# Patient Record
Sex: Male | Born: 1990
Health system: Southern US, Community
[De-identification: ages and names within clinical notes are randomized; demographics above are authoritative.]

## PROBLEM LIST (undated history)

## (undated) DIAGNOSIS — S060X9A Concussion with loss of consciousness of unspecified duration, initial encounter: Secondary | ICD-10-CM

## (undated) DIAGNOSIS — J189 Pneumonia, unspecified organism: Secondary | ICD-10-CM

## (undated) DIAGNOSIS — S060XAA Concussion with loss of consciousness status unknown, initial encounter: Secondary | ICD-10-CM

## (undated) DIAGNOSIS — H9319 Tinnitus, unspecified ear: Secondary | ICD-10-CM

## (undated) DIAGNOSIS — F4310 Post-traumatic stress disorder, unspecified: Secondary | ICD-10-CM

## (undated) HISTORY — DX: Concussion with loss of consciousness status unknown, initial encounter: S06.0XAA

## (undated) HISTORY — PX: KNEE SURGERY: SHX244

## (undated) HISTORY — DX: Concussion with loss of consciousness of unspecified duration, initial encounter: S06.0X9A

## (undated) HISTORY — DX: Post-traumatic stress disorder, unspecified: F43.10

## (undated) HISTORY — DX: Pneumonia, unspecified organism: J18.9

## (undated) HISTORY — DX: Tinnitus, unspecified ear: H93.19

---
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
## 2009-07-15 IMAGING — CR DG CHEST 2V
2 series · 2 of 2 positions shown · non-contrast
Comparison: none

HISTORY: Pneumonia, oxygen desaturation, weakness, fever

[view not recorded (1 of 2)]
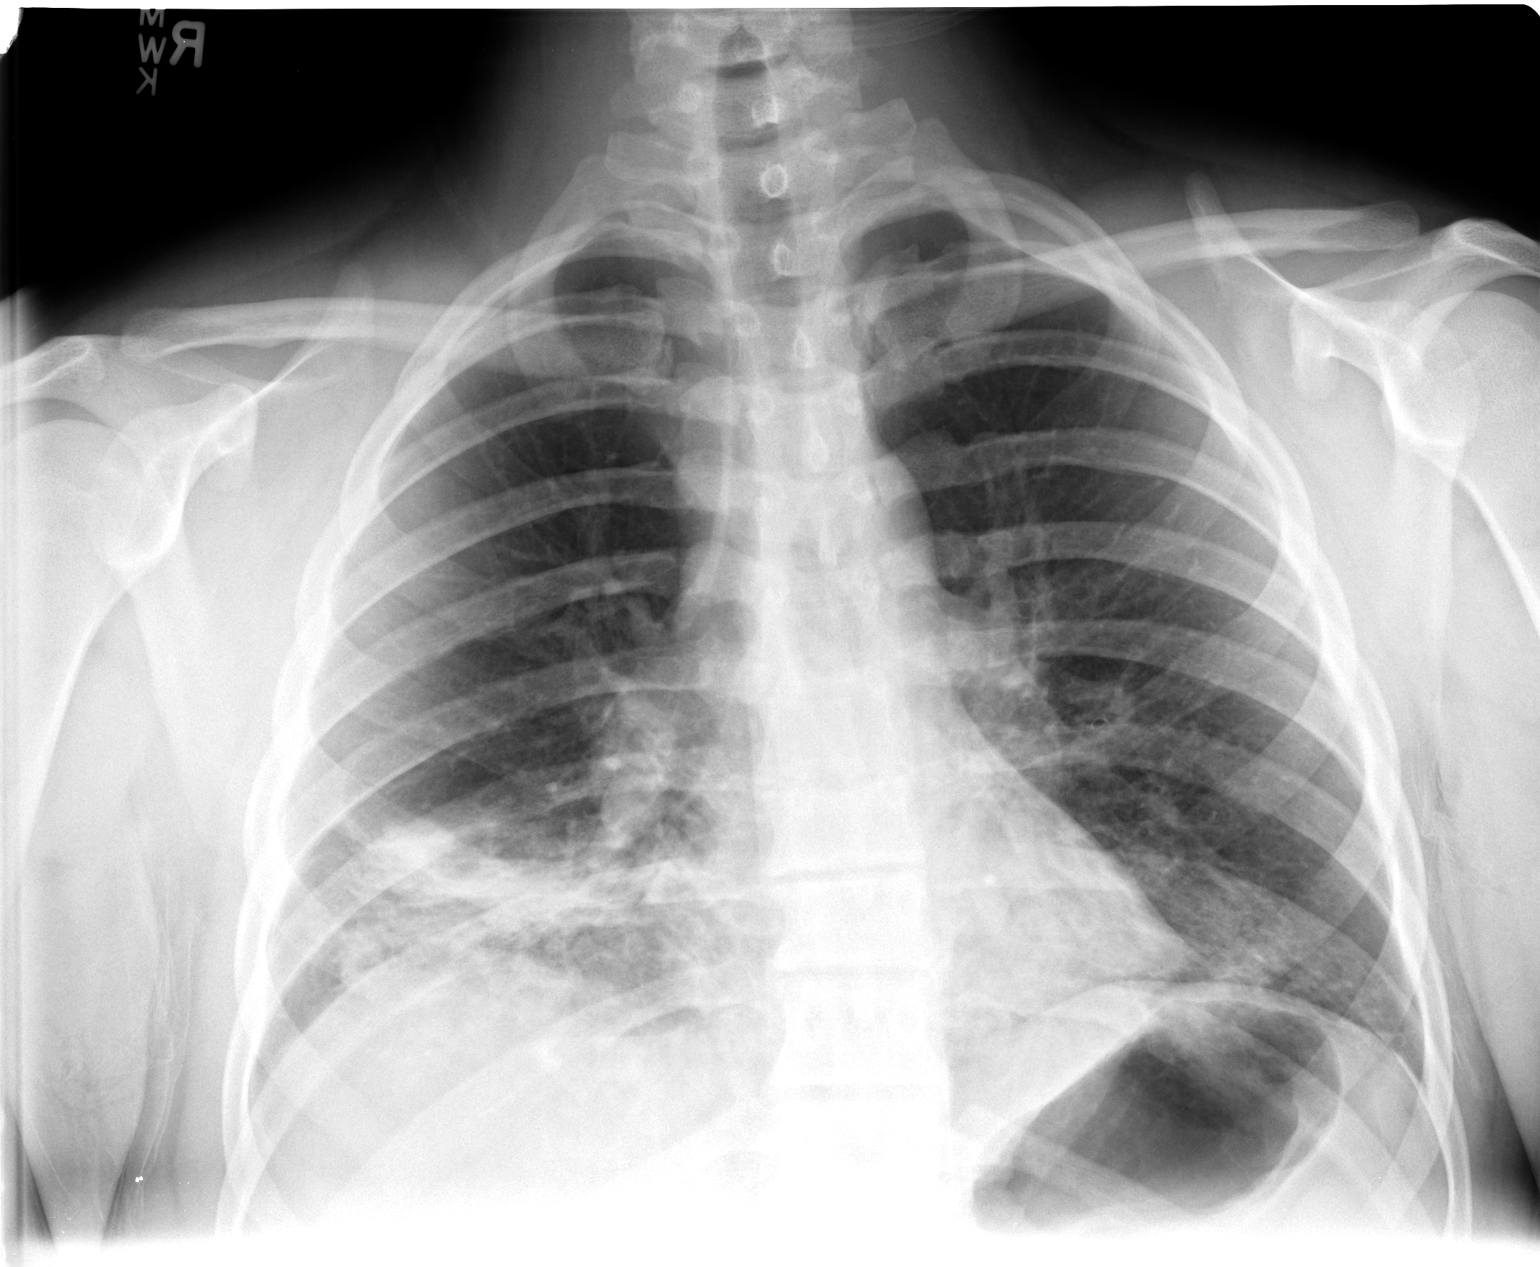

[view not recorded (2 of 2)]
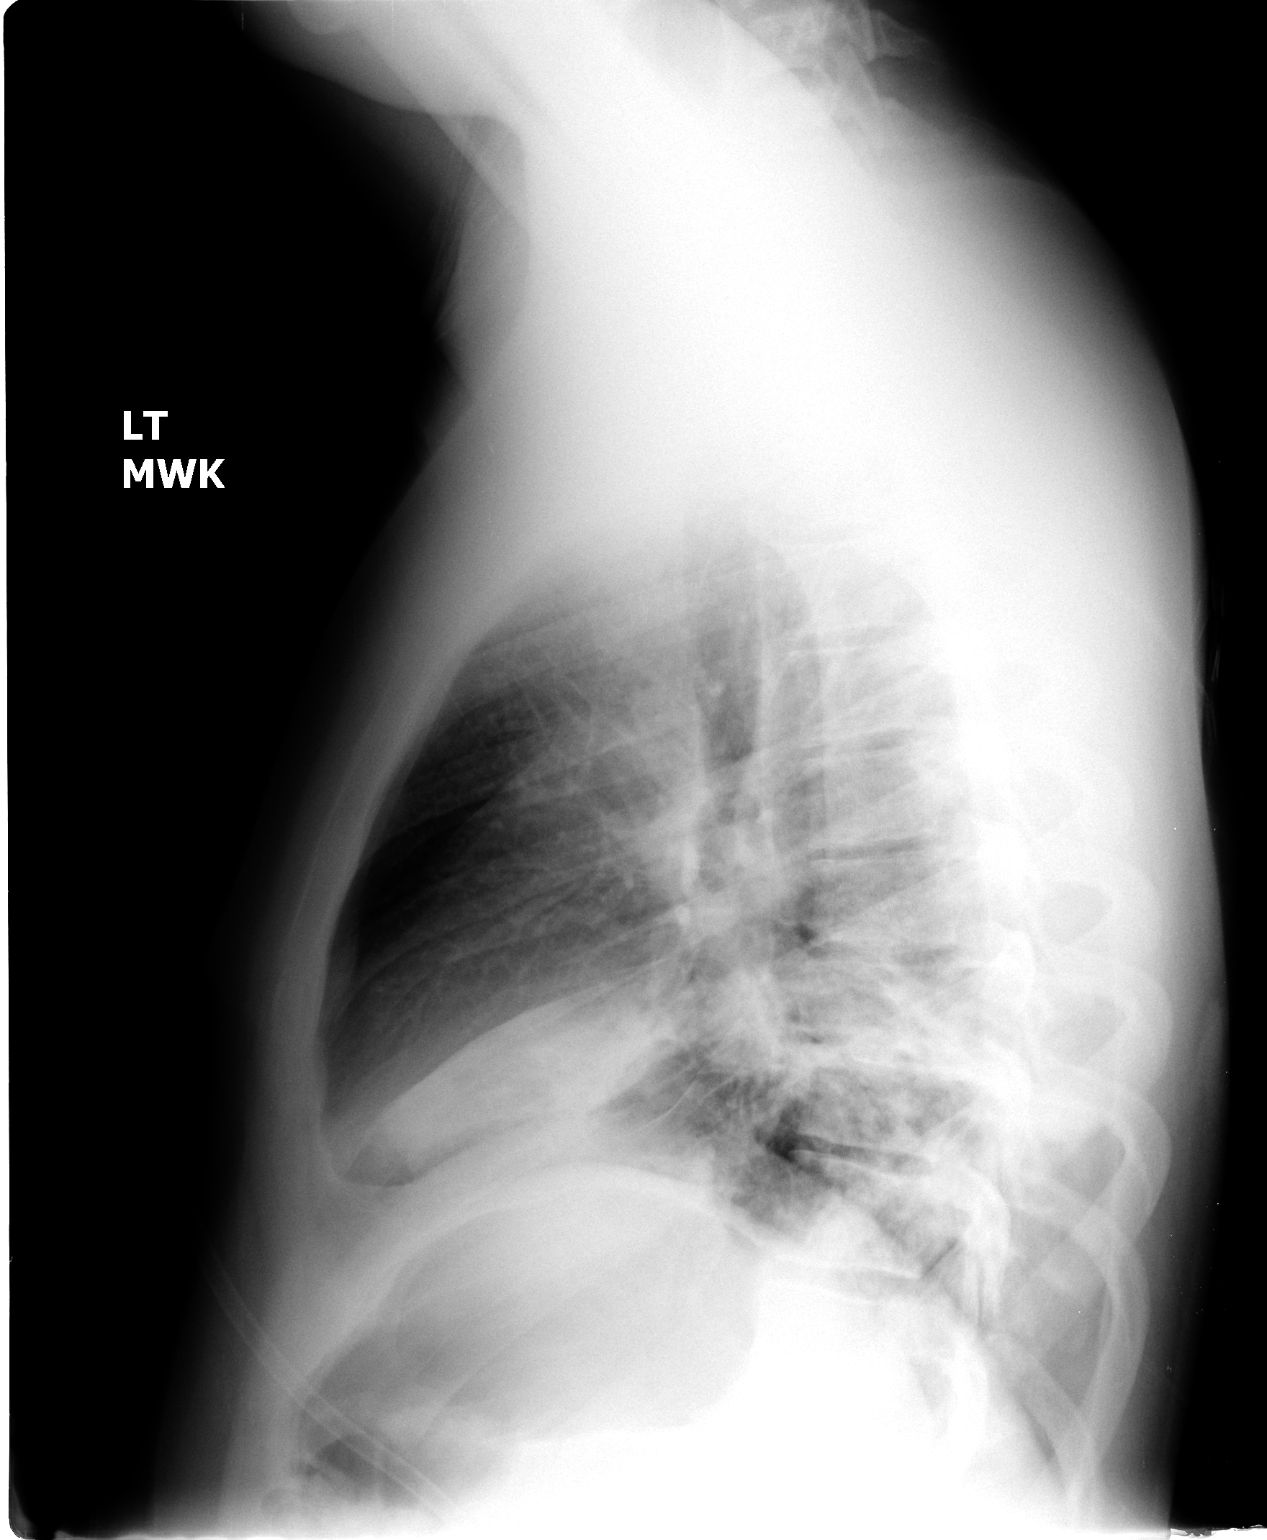

[2 of 2 positions shown; findings below may reference images not displayed]

CHEST 2 VIEWS:

No prior study for comparison.

Normal heart size, mediastinal contours, and pulmonary vascularity.
Slightly prominent hila likely accentuated by basilar hypoinflation.
Right lower lobe infiltrate compatible with pneumonia.
Bibasilar atelectasis as well.
Upper lungs clear.
No pleural effusion.
IMPRESSION: Low lung volumes with mild bibasilar atelectasis and right lower lobe infiltrate
compatible with pneumonia.

## 2010-05-28 NOTE — Discharge Summary (Signed)
Martin Walsh, HOFFMANN NO.:  [id number]   MEDICAL RECORD NO.:  [id number]          PATIENT TYPE:  INP   LOCATION:  6148                         FACILITY:  [facility]   PHYSICIAN:  Henrietta Hoover, MD    DATE OF BIRTH:  16-Jul-1990   DATE OF ADMISSION:  11/29/2006  DATE OF DISCHARGE:  12/04/2006                               DISCHARGE SUMMARY   REASON FOR HOSPITALIZATION:  Pneumonia that failed outpatient treatment.   SIGNIFICANT FINDINGS:  The patient was brought into the ED, and on  admission his chest x-ray showed a right lower lobe infiltrate.  He had  a white blood cell count of 11.5 with 85% neutrophils.  A BMET was taken  and was within normal limits.  A blood culture drawn on November 16 was  no growth to date at the time of discharge.  Influenza A and B were  taken and the test was negative.  Legionella IGG was negative, and  mycoplasma pneumonia IGM antibody was positive at 5260.  He was treated  with oxygen (At maximum, he was requiring 10 liters of oxygen by nasal  cannula but had weaned off by discharge), 5 days of azithromycin, and  Avelox IV 400 mg q.24 hours.  He was able to ambulate without an oxygen  requirement by discharge.  His appetite did slowly improve and fluid  status returned to normal at the time of discharge.   OPERATIONS AND PROCEDURES:  None.   FINAL DIAGNOSIS:  Community-acquired pneumonia.   DISCHARGE MEDICATIONS:  Avelox 400 mg p.o. daily for 10 days.   PENDING RESULTS TO BE FOLLOWED UP:  The final read on the November 16  blood culture, and the chlamydia, pneumonia and adenovirus  nasopharyngeal swabs taken on November 18.   FOLLOWUP:  Will be with Dr. Zenaida Niece in 7 to 10 days.  The mom prefers to  make this appointment herself, and will do so later today.   DISCHARGE WEIGHT:  102 kilograms.   CONDITION ON DISCHARGE:  Improved.      Ardeen Garland, MD  Electronically Signed      Henrietta Hoover, MD  Electronically  Signed   LM/MEDQ  D:  12/04/2006  T:  12/04/2006  Job:  [phone]

## 2010-10-22 LAB — CULTURE, BLOOD (ROUTINE X 2): Culture: NO GROWTH

## 2010-10-22 LAB — CULTURE, RESPIRATORY W GRAM STAIN

## 2010-10-22 LAB — CBC
Hemoglobin: 14.6
Platelets: 283
RDW: 13.1
WBC: 11.5 — ABNORMAL HIGH

## 2010-10-22 LAB — INFLUENZA A+B VIRUS AG-DIRECT(RAPID): Influenza B Ag: NEGATIVE

## 2010-10-22 LAB — DIFFERENTIAL
Basophils Relative: 1
Lymphocytes Relative: 9 — ABNORMAL LOW
Lymphs Abs: 1
Monocytes Relative: 4

## 2010-10-22 LAB — BASIC METABOLIC PANEL
Calcium: 9.3
Chloride: 105
Glucose, Bld: 100 — ABNORMAL HIGH
Potassium: 4.1
Sodium: 139

## 2010-10-22 LAB — MISCELLANEOUS TEST: Miscellaneous Test Results: 1.16

## 2010-10-22 LAB — BLOOD GAS, ARTERIAL
Acid-Base Excess: 0
O2 Content: 2
pH, Arterial: 7.437

## 2010-10-22 LAB — VIRUS CULTURE

## 2014-07-14 ENCOUNTER — Encounter: Payer: Self-pay | Admitting: Family Medicine

## 2014-07-14 ENCOUNTER — Ambulatory Visit (INDEPENDENT_AMBULATORY_CARE_PROVIDER_SITE_OTHER): Payer: BLUE CROSS/BLUE SHIELD | Admitting: Family Medicine

## 2014-07-14 VITALS — BP 124/78 | HR 66 | Ht 74.0 in | Wt 223.0 lb

## 2014-07-14 DIAGNOSIS — S060X9A Concussion with loss of consciousness of unspecified duration, initial encounter: Secondary | ICD-10-CM | POA: Insufficient documentation

## 2014-07-14 DIAGNOSIS — Z0000 Encounter for general adult medical examination without abnormal findings: Secondary | ICD-10-CM

## 2014-07-14 DIAGNOSIS — S060XAA Concussion with loss of consciousness status unknown, initial encounter: Secondary | ICD-10-CM | POA: Insufficient documentation

## 2014-07-14 NOTE — Patient Instructions (Addendum)
Health Maintenance Due  Topic Date Due  . TETANUS/TDAP - please send [place] the date you had this at [facility] 11/04/2007   Overall, things look pretty good  Schedule a lab visit at the front desk. Return for future fasting labs. Nothing but water after midnight please.   Love the basketball - ideally get in 150 minutes exercise a week. Let's try to maintain weight in 210-215 range

## 2014-07-14 NOTE — Progress Notes (Signed)
Martin Reddish, MD Phone: [phone]  Subjective:  Patient presents today to establish care. Was in [employer] and last seen in 2014. Chief complaint-noted.   Tdap at local pharmacy- pt to let us know when  1-2 hours basketball a week  Some over eating. Advised weight stabilization to down about 10 lbs. More regular exercise advised as well  The following were reviewed and entered/updated in epic: Past Medical History  Diagnosis Date  . Concussion     several during [place] time  . Pneumonia 2008   Patient Active Problem List   Diagnosis Date Noted  . Concussion    Past Surgical History  Procedure Laterality Date  . Knee surgery      torn meniscus- x 2 bilateral    Family History  Problem Relation Age of Onset  . Hypertension Father   . COPD Father     smoker  . Glaucoma Maternal Grandmother   . Lung cancer Maternal Grandfather     asbestos related    Medications- reviewed and updated  Allergies-reviewed and updated No Known Allergies  History   Social History  . Marital Status: Single    Spouse Name: N/A  . Number of Children: N/A  . Years of Education: N/A   Social History Main Topics  . Smoking status: Never Smoker   . Smokeless tobacco: Not on file  . Alcohol Use: 3.0 oz/week    5 Standard drinks or equivalent per week  . Drug Use: No  . Sexual Activity: Not on file   Other Topics Concern  . Not on file   Social History Narrative   Family: Married (wife Katelyn patient of Dr. Yong Channel). Hendrix son born early 2016.       Work: Works in [employer] in [place]      Hobbies: basketball, video games, movies    ROS--See HPI (unremarkable) , otherwise full ROS was completed and negative except as noted above  Objective: BP 124/78 mmHg  Pulse 66  Ht 6\' 2"  (1.88 m)  Wt 223 lb (101.152 kg)  BMI 28.62 kg/m2 Gen: NAD, resting comfortably HEENT: Mucous membranes are moist. Oropharynx normal. TM normal. Eyes: sclera  and lids normal, PERRLA Neck: no thyromegaly, no cervical lymphadenopathy CV: RRR no murmurs rubs or gallops Lungs: CTAB no crackles, wheeze, rhonchi Abdomen: soft/nontender/nondistended/normal bowel sounds. No rebound or guarding.  Ext: no edema, 2+ PT pulses Skin: warm, dry, no rash Neuro: 5/5 strength in upper and lower extremities, normal gait, normal reflexes   Assessment/Plan:  24 y.o. male presenting for annual physical.  Health Maintenance counseling: 1. Anticipatory guidance: Patient counseled regarding regular dental exams (last 2014), wearing seatbelts, sunscreen.   2. Risk factor reduction:  Advised patient of need for regular exercise (basketball 1-2x a week, job active as well) and diet rich and fruits and vegetables to reduce risk of heart attack and stroke.  3. Immunizations/screenings/ancillary studies Health Maintenance Due  Topic Date Due  . TETANUS/TDAP  11/04/2007  4. Advised self testicular exams  Future fasting baseline labs Orders Placed This Encounter  Procedures  . CBC    [facility]    Standing Status: Future     Number of Occurrences:      Standing Expiration Date: 07/14/2015  . Comprehensive metabolic panel    [place]    Standing Status: Future     Number of Occurrences:      Standing Expiration Date: 07/14/2015    Order Specific Question:  Has the patient  fasted?    Answer:  No  . Lipid panel    [place]    Standing Status: Future     Number of Occurrences:      Standing Expiration Date: 07/14/2015    Order Specific Question:  Has the patient fasted?    Answer:  No  . TSH    [place]    Standing Status: Future     Number of Occurrences:      Standing Expiration Date: 07/14/2015  . POCT urinalysis dipstick    Standing Status: Future     Number of Occurrences:      Standing Expiration Date: 07/14/2015

## 2014-07-18 ENCOUNTER — Other Ambulatory Visit: Payer: BLUE CROSS/BLUE SHIELD

## 2014-10-03 ENCOUNTER — Ambulatory Visit: Payer: BLUE CROSS/BLUE SHIELD | Admitting: Family Medicine

## 2015-03-12 ENCOUNTER — Ambulatory Visit: Payer: Self-pay | Admitting: Family Medicine

## 2015-08-27 ENCOUNTER — Encounter: Payer: Self-pay | Admitting: Family Medicine

## 2015-08-27 ENCOUNTER — Ambulatory Visit (INDEPENDENT_AMBULATORY_CARE_PROVIDER_SITE_OTHER): Payer: BLUE CROSS/BLUE SHIELD | Admitting: Family Medicine

## 2015-08-27 VITALS — BP 120/80 | HR 96 | Temp 98.7°F | Wt 230.2 lb

## 2015-08-27 DIAGNOSIS — L03116 Cellulitis of left lower limb: Secondary | ICD-10-CM | POA: Diagnosis not present

## 2015-08-27 MED ORDER — CEPHALEXIN 500 MG PO CAPS
500.0000 mg | ORAL_CAPSULE | Freq: Four times a day (QID) | ORAL | 0 refills | Status: DC
Start: 2015-08-27 — End: 2016-02-04

## 2015-08-27 NOTE — Patient Instructions (Signed)
Keflex for 7 days 4x a day. If not improving within 48 hours- return to care- sooner if worsens. Also do warm compresses as there may be a very small abscess in center but not enough to drain here   Cellulitis Cellulitis is an infection of the skin and the tissue beneath it. The infected area is usually red and tender. Cellulitis occurs most often in the arms and lower legs.  CAUSES  Cellulitis is caused by bacteria that enter the skin through cracks or cuts in the skin. The most common types of bacteria that cause cellulitis are staphylococci and streptococci. SIGNS AND SYMPTOMS   Redness and warmth.  Swelling.  Tenderness or pain.  Fever. DIAGNOSIS  Your health care provider can usually determine what is wrong based on a physical exam. Blood tests may also be done. TREATMENT  Treatment usually involves taking an antibiotic medicine. HOME CARE INSTRUCTIONS   Take your antibiotic medicine as directed by your health care provider. Finish the antibiotic even if you start to feel better.  Keep the infected arm or leg elevated to reduce swelling.  Apply a warm cloth to the affected area up to 4 times per day to relieve pain.  Take medicines only as directed by your health care provider.  Keep all follow-up visits as directed by your health care provider. SEEK MEDICAL CARE IF:   You notice red streaks coming from the infected area.  Your red area gets larger or turns dark in color.  Your bone or joint underneath the infected area becomes painful after the skin has healed.  Your infection returns in the same area or another area.  You notice a swollen bump in the infected area.  You develop new symptoms.  You have a fever. SEEK IMMEDIATE MEDICAL CARE IF:   You feel very sleepy.  You develop vomiting or diarrhea.  You have a general ill feeling (malaise) with muscle aches and pains.   This information is not intended to replace advice given to you by your health care  provider. Make sure you discuss any questions you have with your health care provider.   Document Released: 10/09/2004 Document Revised: 09/20/2014 Document Reviewed: 03/17/2011 Elsevier Interactive Patient Education [employer].

## 2015-08-27 NOTE — Progress Notes (Signed)
Subjective:  Martin Walsh is a 25 y.o. year old very pleasant male patient who presents for/with See problem oriented charting ROS- no fever, chills, nausea, vomiting.see any ROS included in HPI as well.   Past Medical History-  Patient Active Problem List   Diagnosis Date Noted  . Concussion     Medications- reviewed and updated, none  Objective: BP 120/80 (BP Location: Left Arm, Patient Position: Sitting, Cuff Size: Normal)   Pulse 96   Temp 98.7 F (37.1 C) (Oral)   Wt 230 lb 3.2 oz (104.4 kg)   SpO2 96%   BMI 29.56 kg/m  Gen: NAD, resting comfortably CV: RRR no murmurs rubs or gallops Lungs: CTAB no crackles, wheeze, rhonchi Ext: no edema pretibially Skin: warm, dry, just below left knee there is a 3 x 4 cm erythematous area with central bump perhaps 5 mm insize with no obvious fluctuance, mild pain with palpation Neuro: grossly normal, moves all extremities  Assessment/Plan:   Left leg skin infection concern S: about 5 days ago patient noted a small spot on left leg below knee that he thought was an infected hair follicle. Since then the redness has expanded and the bump has expanded from pen tip size to a few mm. Mild pain worse with palpation. Has tried some ibuprofen for pain which helped- thought I may help with inflammation but did not seem to.  A/P: Appears to be cellulitis with possible origin from infected hair follicle. There is no obvious portion of this that would benefit from I+D with no fluctuance though I do wonder if central area has small abscess- will treat with keflex x 7 days but gave strict return precautions. No coverage for MRSA at this point which we discussed if failure would need to switch therapies.   Meds ordered this encounter  Medications  . cephALEXin (KEFLEX) 500 MG capsule    Sig: Take 1 capsule (500 mg total) by mouth 4 (four) times daily.    Dispense:  28 capsule    Refill:  0    Return precautions advised.  Garret Reddish, MD

## 2015-08-27 NOTE — Progress Notes (Signed)
Pre visit review using our clinic review tool, if applicable. No additional management support is needed unless otherwise documented below in the visit note. 

## 2016-02-04 ENCOUNTER — Ambulatory Visit (INDEPENDENT_AMBULATORY_CARE_PROVIDER_SITE_OTHER): Payer: 59 | Admitting: Family Medicine

## 2016-02-04 ENCOUNTER — Encounter: Payer: Self-pay | Admitting: *Deleted

## 2016-02-04 VITALS — BP 122/90 | HR 90 | Temp 98.7°F | Ht 74.0 in | Wt 232.5 lb

## 2016-02-04 DIAGNOSIS — J111 Influenza due to unidentified influenza virus with other respiratory manifestations: Secondary | ICD-10-CM

## 2016-02-04 MED ORDER — OSELTAMIVIR PHOSPHATE 75 MG PO CAPS: 75.0000 mg | ORAL_CAPSULE | Freq: Two times a day (BID) | ORAL | 0 refills | Status: DC

## 2016-02-04 NOTE — Progress Notes (Signed)
HPI:  URI -started: 2 days ago -symptoms:nasal congestion, sore throat, cough, bodyaches, fever to 101 initially, fever last night, took analgesic this morning -denies:SOB, NVD, tooth pain -has tried: OTC options -sick contacts/travel/risks: no reported flu, strep or tick exposure -has 26 year old ROS: See pertinent positives and negatives per HPI.  Past Medical History:  Diagnosis Date  . Concussion    several during [place] time  . Pneumonia 2008    Past Surgical History:  Procedure Laterality Date  . KNEE SURGERY     torn meniscus- x 2 bilateral    Family History  Problem Relation Age of Onset  . Hypertension Father   . COPD Father     smoker  . Glaucoma Maternal Grandmother   . Lung cancer Maternal Grandfather     asbestos related    Social History   Social History  . Marital status: Single    Spouse name: N/A  . Number of children: N/A  . Years of education: N/A   Social History Main Topics  . Smoking status: Never Smoker  . Smokeless tobacco: Not on file  . Alcohol use 3.0 oz/week    5 Standard drinks or equivalent per week  . Drug use: No  . Sexual activity: Not on file   Other Topics Concern  . Not on file   Social History Narrative   Family: Married (wife Katelyn patient of Dr. Yong Channel). Hendrix son born early 2016.       Work: Works in [employer] [place]      Hobbies: basketball, video games, movies     Current Outpatient Prescriptions:  .  oseltamivir (TAMIFLU) 75 MG capsule, Take 1 capsule (75 mg total) by mouth 2 (two) times daily., Disp: 10 capsule, Rfl: 0  EXAM:  Vitals:   02/04/16 1036  BP: 122/90  Pulse: 90  Temp: 98.7 F (37.1 C)    Body mass index is 29.85 kg/m.  GENERAL: vitals reviewed and listed above, alert, oriented, appears well hydrated and in no acute distress  HEENT: atraumatic, conjunttiva clear, no obvious abnormalities on inspection of external nose and ears, normal  appearance of ear canals and TMs, clear nasal congestion, mild post oropharyngeal erythema with PND, no tonsillar edema or exudate, no sinus TTP  NECK: no obvious masses on inspection  LUNGS: clear to auscultation bilaterally, no wheezes, rales or rhonchi, good air movement  CV: HRRR, no peripheral edema  MS: moves all extremities without noticeable abnormality  PSYCH: pleasant and cooperative, no obvious depression or anxiety  ASSESSMENT AND PLAN:  Discussed the following assessment and plan:  Influenza  We discussed potential etiologies, with VURI or influenza being most likely.We discussed symptoms, transmission, potential complications, individuals at higher risks, testing, treatment and risks/indications/limitation, treatment side effects, likely course, signs of developing a serious illness and return precuations.He opted against testing given classic symptoms. Tamiflu rx provided per his request given has child under 5. He agrees to notify his child's pediatrician. -of course, we advised to return or notify a doctor immediately if symptoms worsen or persist or new concerns arise.    Patient Instructions  Try to stay away from your child if possible and notify your child's pediatrician today that you have the flu.   Please do not return to work until no fever or vomiting for 24-48 hours.    Influenza, Adult Influenza, more commonly known as "the flu," is a viral infection that primarily affects the respiratory tract. The  respiratory tract includes organs that help you breathe, such as the lungs, nose, and throat. The flu causes many common cold symptoms, as well as a high fever and body aches. The flu spreads easily from person to person (is contagious). Getting a flu shot (influenza vaccination) every year is the best way to prevent influenza. What are the causes? Influenza is caused by a virus. You can catch the virus by:  Breathing in droplets from an infected person's  cough or sneeze.  Touching something that was recently contaminated with the virus and then touching your mouth, nose, or eyes. What increases the risk? The following factors may make you more likely to get the flu:  Not cleaning your hands frequently with soap and water or alcohol-based hand sanitizer.  Having close contact with many people during cold and flu season.  Touching your mouth, eyes, or nose without washing or sanitizing your hands first.  Not drinking enough fluids or not eating a healthy diet.  Not getting enough sleep or exercise.  Being under a high amount of stress.  Not getting a yearly (annual) flu shot. You may be at a higher risk of complications from the flu, such as a severe lung infection (pneumonia), if you:  Are over the age of 28.  Are pregnant.  Have a weakened disease-fighting system (immune system). You may have a weakened immune system if you:  Have HIV or AIDS.  Are undergoing chemotherapy.  Aretaking medicines that reduce the activity of (suppress) the immune system.  Have a long-term (chronic) illness, such as heart disease, kidney disease, diabetes, or lung disease.  Have a liver disorder.  Are obese.  Have anemia. What are the signs or symptoms? Symptoms of this condition typically last 4-10 days and may include:  Fever.  Chills.  Headache, body aches, or muscle aches.  Sore throat.  Cough.  Runny or congested nose.  Chest discomfort and cough.  Poor appetite.  Weakness or tiredness (fatigue).  Dizziness.  Nausea or vomiting. How is this diagnosed? This condition may be diagnosed based on your medical history and a physical exam. Your health care provider may do a nose or throat swab test to confirm the diagnosis. How is this treated? If influenza is detected early, you can be treated with antiviral medicine that can reduce the length of your illness and the severity of your symptoms. This medicine may be given  by mouth (orally) or through an IV tube that is inserted in one of your veins. The goal of treatment is to relieve symptoms by taking care of yourself at home. This may include taking over-the-counter medicines, drinking plenty of fluids, and adding humidity to the air in your home. In some cases, influenza goes away on its own. Severe influenza or complications from influenza may be treated in a hospital. Follow these instructions at home:  Take over-the-counter and prescription medicines only as told by your health care provider.  Use a cool mist humidifier to add humidity to the air in your home. This can make breathing easier.  Rest as needed.  Drink enough fluid to keep your urine clear or pale yellow.  Cover your mouth and nose when you cough or sneeze.  Wash your hands with soap and water often, especially after you cough or sneeze. If soap and water are not available, use hand sanitizer.  Stay home from work or school as told by your health care provider. Unless you are visiting your health care provider, try  to avoid leaving home until your fever has been gone for 24 hours without the use of medicine.  Keep all follow-up visits as told by your health care provider. This is important. How is this prevented?  Getting an annual flu shot is the best way to avoid getting the flu. You may get the flu shot in late summer, fall, or winter. Ask your health care provider when you should get your flu shot.  Wash your hands often or use hand sanitizer often.  Avoid contact with people who are sick during cold and flu season.  Eat a healthy diet, drink plenty of fluids, get enough sleep, and exercise regularly. Contact a health care provider if:  You develop new symptoms.  You have:  Chest pain.  Diarrhea.  A fever.  Your cough gets worse.  You produce more mucus.  You feel nauseous or you vomit. Get help right away if:  You develop shortness of breath or difficulty  breathing.  Your skin or nails turn a bluish color.  You have severe pain or stiffness in your neck.  You develop a sudden headache or sudden pain in your face or ear.  You cannot stop vomiting. This information is not intended to replace advice given to you by your health care provider. Make sure you discuss any questions you have with your health care provider. Document Released: 12/28/1999 Document Revised: 06/07/2015 Document Reviewed: 10/24/2014 Elsevier Interactive Patient Education  2017 [employer]., DO

## 2016-02-04 NOTE — Progress Notes (Signed)
Pre visit review using our clinic review tool, if applicable. No additional management support is needed unless otherwise documented below in the visit note. 

## 2016-02-04 NOTE — Patient Instructions (Signed)
Try to stay away from your child if possible and notify your child's pediatrician today that you have the flu.   Please do not return to work until no fever or vomiting for 24-48 hours.    Influenza, Adult Influenza, more commonly known as "the flu," is a viral infection that primarily affects the respiratory tract. The respiratory tract includes organs that help you breathe, such as the lungs, nose, and throat. The flu causes many common cold symptoms, as well as a high fever and body aches. The flu spreads easily from person to person (is contagious). Getting a flu shot (influenza vaccination) every year is the best way to prevent influenza. What are the causes? Influenza is caused by a virus. You can catch the virus by:  Breathing in droplets from an infected person's cough or sneeze.  Touching something that was recently contaminated with the virus and then touching your mouth, nose, or eyes. What increases the risk? The following factors may make you more likely to get the flu:  Not cleaning your hands frequently with soap and water or alcohol-based hand sanitizer.  Having close contact with many people during cold and flu season.  Touching your mouth, eyes, or nose without washing or sanitizing your hands first.  Not drinking enough fluids or not eating a healthy diet.  Not getting enough sleep or exercise.  Being under a high amount of stress.  Not getting a yearly (annual) flu shot. You may be at a higher risk of complications from the flu, such as a severe lung infection (pneumonia), if you:  Are over the age of 5.  Are pregnant.  Have a weakened disease-fighting system (immune system). You may have a weakened immune system if you:  Have HIV or AIDS.  Are undergoing chemotherapy.  Aretaking medicines that reduce the activity of (suppress) the immune system.  Have a long-term (chronic) illness, such as heart disease, kidney disease, diabetes, or lung  disease.  Have a liver disorder.  Are obese.  Have anemia. What are the signs or symptoms? Symptoms of this condition typically last 4-10 days and may include:  Fever.  Chills.  Headache, body aches, or muscle aches.  Sore throat.  Cough.  Runny or congested nose.  Chest discomfort and cough.  Poor appetite.  Weakness or tiredness (fatigue).  Dizziness.  Nausea or vomiting. How is this diagnosed? This condition may be diagnosed based on your medical history and a physical exam. Your health care provider may do a nose or throat swab test to confirm the diagnosis. How is this treated? If influenza is detected early, you can be treated with antiviral medicine that can reduce the length of your illness and the severity of your symptoms. This medicine may be given by mouth (orally) or through an IV tube that is inserted in one of your veins. The goal of treatment is to relieve symptoms by taking care of yourself at home. This may include taking over-the-counter medicines, drinking plenty of fluids, and adding humidity to the air in your home. In some cases, influenza goes away on its own. Severe influenza or complications from influenza may be treated in a hospital. Follow these instructions at home:  Take over-the-counter and prescription medicines only as told by your health care provider.  Use a cool mist humidifier to add humidity to the air in your home. This can make breathing easier.  Rest as needed.  Drink enough fluid to keep your urine clear or pale yellow.  Cover your mouth and nose when you cough or sneeze.  Wash your hands with soap and water often, especially after you cough or sneeze. If soap and water are not available, use hand sanitizer.  Stay home from work or school as told by your health care provider. Unless you are visiting your health care provider, try to avoid leaving home until your fever has been gone for 24 hours without the use of  medicine.  Keep all follow-up visits as told by your health care provider. This is important. How is this prevented?  Getting an annual flu shot is the best way to avoid getting the flu. You may get the flu shot in late summer, fall, or winter. Ask your health care provider when you should get your flu shot.  Wash your hands often or use hand sanitizer often.  Avoid contact with people who are sick during cold and flu season.  Eat a healthy diet, drink plenty of fluids, get enough sleep, and exercise regularly. Contact a health care provider if:  You develop new symptoms.  You have:  Chest pain.  Diarrhea.  A fever.  Your cough gets worse.  You produce more mucus.  You feel nauseous or you vomit. Get help right away if:  You develop shortness of breath or difficulty breathing.  Your skin or nails turn a bluish color.  You have severe pain or stiffness in your neck.  You develop a sudden headache or sudden pain in your face or ear.  You cannot stop vomiting. This information is not intended to replace advice given to you by your health care provider. Make sure you discuss any questions you have with your health care provider. Document Released: 12/28/1999 Document Revised: 06/07/2015 Document Reviewed: 10/24/2014 Elsevier Interactive Patient Education  2017 [employer].

## 2016-07-11 ENCOUNTER — Encounter: Payer: Self-pay | Admitting: Family Medicine

## 2016-07-11 ENCOUNTER — Ambulatory Visit (INDEPENDENT_AMBULATORY_CARE_PROVIDER_SITE_OTHER): Payer: 59 | Admitting: Family Medicine

## 2016-07-11 VITALS — BP 120/82 | HR 96 | Temp 99.0°F | Ht 74.25 in | Wt 232.4 lb

## 2016-07-11 DIAGNOSIS — H9313 Tinnitus, bilateral: Secondary | ICD-10-CM

## 2016-07-11 DIAGNOSIS — Z0000 Encounter for general adult medical examination without abnormal findings: Secondary | ICD-10-CM | POA: Diagnosis not present

## 2016-07-11 DIAGNOSIS — F4310 Post-traumatic stress disorder, unspecified: Secondary | ICD-10-CM | POA: Diagnosis not present

## 2016-07-11 DIAGNOSIS — Z13220 Encounter for screening for lipoid disorders: Secondary | ICD-10-CM

## 2016-07-11 DIAGNOSIS — H9319 Tinnitus, unspecified ear: Secondary | ICD-10-CM | POA: Insufficient documentation

## 2016-07-11 NOTE — Progress Notes (Signed)
Phone: [phone]  Subjective:  Patient presents today for their annual physical. Chief complaint-noted.   See problem oriented charting- ROS- full  review of systems was completed and negative including No chest pain or shortness of breath. No headache or blurry vision.   The following were reviewed and entered/updated in epic: Past Medical History:  Diagnosis Date  . Concussion    several during [place] time  . Pneumonia 2008  . PTSD (post-traumatic stress disorder)    mild. counseling once a year. no rx  . Tinnitus    noise exposure with [employer]. no hearing loss- evaluated through [place]   Patient Active Problem List   Diagnosis Date Noted  . Tinnitus   . PTSD (post-traumatic stress disorder)   . Concussion    Past Surgical History:  Procedure Laterality Date  . KNEE SURGERY     torn meniscus- x 2 bilateral    Family History  Problem Relation Age of Onset  . Hypertension Father   . COPD Father        smoker  . Glaucoma Maternal Grandmother   . Lung cancer Maternal Grandfather        asbestos related    Medications- reviewed and updated No current outpatient prescriptions on file.   No current facility-administered medications for this visit.     Allergies-reviewed and updated No Known Allergies  Social History   Social History  . Marital status: Single    Spouse name: N/A  . Number of children: N/A  . Years of education: N/A   Social History Main Topics  . Smoking status: Never Smoker  . Smokeless tobacco: Never Used  . Alcohol use 3.0 oz/week    5 Standard drinks or equivalent per week  . Drug use: No  . Sexual activity: Yes   Other Topics Concern  . None   Social History Narrative   Family: Married (wife Katelyn patient of Dr. Yong Channel). Hendrix son born early 2016.       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   Prior in [place]      Hobbies: basketball, video games, movies     Objective: BP 120/82 (BP Location: Left Arm, Patient Position: Sitting, Cuff Size: Large)   Pulse 96   Temp 99 F (37.2 C) (Oral)   Ht 6' 2.25" (1.886 m)   Wt 232 lb 6.4 oz (105.4 kg)   SpO2 95%   BMI 29.64 kg/m  Gen: NAD, resting comfortably HEENT: Mucous membranes are moist. Oropharynx normal Neck: no thyromegaly CV: RRR no murmurs rubs or gallops Lungs: CTAB no crackles, wheeze, rhonchi Abdomen: soft/nontender/nondistended/normal bowel sounds. No rebound or guarding.  Ext: no edema Skin: warm, dry Neuro: grossly normal, moves all extremities, PERRLA  Assessment/Plan:  26 y.o. male presenting for annual physical.  Health Maintenance counseling: 1. Anticipatory guidance: Patient counseled regarding regular dental exams - October or so, eye exams - visit planned in a month- last 2 years , wearing seatbelts.  2. Risk factor reduction:  Advised patient of need for regular exercise and diet rich and fruits and vegetables to reduce risk of heart attack and stroke. Exercise- no longer playing basketball- gym 2-3x a week- not as active bc job was active was active before went back to school. Diet-eats whatever he wants- a lot of butter, high fat.   We discussed healthier diet and target within a year weight around 215 Wt Readings from Last 3 Encounters:  07/11/16 232  lb 6.4 oz (105.4 kg)  02/04/16 232 lb 8 oz (105.5 kg)  08/27/15 230 lb 3.2 oz (104.4 kg)  3. Immunizations/screenings/ancillary studies- advised tdap today (he states local pharmacy given and he will locate records) Immunization History  Administered Date(s) Administered  . Influenza-Unspecified 11/14/2015  . Tdap 02/10/2014  4. Prostate cancer screening- no family history, start at age 69-55  5. Colon cancer screening - no family history, start at age 3-50 6. Skin cancer screening/prevention- advised regular sunscreen use.  7. Testicular cancer screening- advised monthly self exams - doing this regularly 8. 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   Target weight 215 within 1 year  Updates me on [place] workup of tinnitus (no hearing loss) and mild PTSD (plans to have yearly check ins on this but currently doing well)  Return in about 1 year (around 07/11/2017) for physical.  Orders Placed This Encounter  Procedures  . CBC    Standing Status:   Future    Standing Expiration Date:   07/11/2017  . Lipid panel    Standing Status:   Future    Standing Expiration Date:   07/11/2017  . Comprehensive metabolic panel    [place]    Standing Status:   Future    Standing Expiration Date:   07/11/2017   Return precautions advised.  Garret Reddish, MD

## 2016-07-11 NOTE — Patient Instructions (Addendum)
We discussed healthier diet and target within a year weight around 215  Please check with your wife to try to find your last tetanus shot date and send [place] a message through mychart  Schedule a lab visit at the check out desk within 2 weeks. Return for future fasting labs meaning nothing but water after midnight please. Ok to take your medications with water.

## 2016-07-18 ENCOUNTER — Other Ambulatory Visit: Payer: 59

## 2016-11-19 DIAGNOSIS — Z23 Encounter for immunization: Secondary | ICD-10-CM | POA: Diagnosis not present

## 2016-12-16 DIAGNOSIS — H5213 Myopia, bilateral: Secondary | ICD-10-CM | POA: Diagnosis not present

## 2017-07-13 ENCOUNTER — Encounter: Payer: 59 | Admitting: Family Medicine

## 2017-09-04 ENCOUNTER — Encounter: Payer: 59 | Admitting: Family Medicine

## 2017-11-26 DIAGNOSIS — Z23 Encounter for immunization: Secondary | ICD-10-CM | POA: Diagnosis not present

## 2018-01-22 ENCOUNTER — Ambulatory Visit: Payer: 59 | Admitting: Family Medicine

## 2018-02-05 ENCOUNTER — Encounter: Payer: Self-pay | Admitting: Family Medicine

## 2018-02-05 ENCOUNTER — Ambulatory Visit (INDEPENDENT_AMBULATORY_CARE_PROVIDER_SITE_OTHER): Payer: No Typology Code available for payment source | Admitting: Family Medicine

## 2018-02-05 VITALS — BP 104/76 | HR 85 | Temp 98.5°F | Ht 74.25 in | Wt 226.2 lb

## 2018-02-05 DIAGNOSIS — Z13220 Encounter for screening for lipoid disorders: Secondary | ICD-10-CM

## 2018-02-05 DIAGNOSIS — Z3009 Encounter for other general counseling and advice on contraception: Secondary | ICD-10-CM

## 2018-02-05 DIAGNOSIS — Z6828 Body mass index (BMI) 28.0-28.9, adult: Secondary | ICD-10-CM

## 2018-02-05 DIAGNOSIS — Z0000 Encounter for general adult medical examination without abnormal findings: Secondary | ICD-10-CM | POA: Diagnosis not present

## 2018-02-05 LAB — COMPREHENSIVE METABOLIC PANEL
ALT: 13 U/L (ref 0–53)
AST: 14 U/L (ref 0–37)
Albumin: 4.8 g/dL (ref 3.5–5.2)
Alkaline Phosphatase: 41 U/L (ref 39–117)
BUN: 21 mg/dL (ref 6–23)
CHLORIDE: 102 meq/L (ref 96–112)
CO2: 30 meq/L (ref 19–32)
CREATININE: 1.01 mg/dL (ref 0.40–1.50)
Calcium: 9.9 mg/dL (ref 8.4–10.5)
GFR: 87.18 mL/min (ref >60.00)
GLUCOSE: 99 mg/dL (ref 70–99)
Potassium: 4.1 mEq/L (ref 3.5–5.1)
Sodium: 140 mEq/L (ref 135–145)
Total Bilirubin: 1 mg/dL (ref 0.2–1.2)
Total Protein: 7.1 g/dL (ref 6.0–8.3)

## 2018-02-05 LAB — LIPID PANEL
CHOL/HDL RATIO: 4
Cholesterol: 173 mg/dL (ref 0–200)
HDL: 40 mg/dL (ref >39.00)
LDL CALC: 101 mg/dL — AB (ref 0–99)
NonHDL: 132.63
Triglycerides: 156 mg/dL — ABNORMAL HIGH (ref 0.0–149.0)
VLDL: 31.2 mg/dL (ref 0.0–40.0)

## 2018-02-05 LAB — CBC
HCT: 45.9 % (ref 39.0–52.0)
Hemoglobin: 15.7 g/dL (ref 13.0–17.0)
MCHC: 34.2 g/dL (ref 30.0–36.0)
MCV: 87.3 fl (ref 78.0–100.0)
Platelets: 196 10*3/uL (ref 150.0–400.0)
RBC: 5.26 Mil/uL (ref 4.22–5.81)
RDW: 13.6 % (ref 11.5–15.5)
WBC: 6.9 10*3/uL (ref 4.0–10.5)

## 2018-02-05 NOTE — Patient Instructions (Addendum)
Please stop by lab before you go If you do not have mychart- we will call you about results within 5 business days of us receiving them.  If you have mychart- we will send your results within 3 business days of us receiving them.  If abnormal or we want to clarify a result, we will call or mychart you to make sure you receive the message.  If you have questions or concerns or don't hear within 5-7 days, please send us a message or call us.    We will call you within two weeks about your referral to urology. If you do not hear within 3 weeks, give us a call.    

## 2018-02-05 NOTE — Progress Notes (Signed)
Phone: [phone]    Subjective:  Patient presents today for their annual physical. Chief complaint-noted.   See problem oriented charting- ROS- full  review of systems was completed and negative except for fatigue in caring for children   BMI monitoring- elevated BMI noted: Body mass index is 28.85 kg/m. Encouraged need for healthy eating, regular exercise, weight loss.    The following were reviewed and entered/updated in epic: Past Medical History:  Diagnosis Date  . Concussion    several during [place] time  . Pneumonia 2008  . PTSD (post-traumatic stress disorder)    mild. counseling once a year. no rx  . Tinnitus    noise exposure with [employer]. no hearing loss- evaluated through [place]   Patient Active Problem List   Diagnosis Date Noted  . Tinnitus   . PTSD (post-traumatic stress disorder)   . Concussion    Past Surgical History:  Procedure Laterality Date  . KNEE SURGERY     torn meniscus- x 2 bilateral    Family History  Problem Relation Age of Onset  . Hypertension Father   . COPD Father        smoker  . Glaucoma Maternal Grandmother   . Lung cancer Maternal Grandfather        asbestos related    Medications- reviewed and updated No current outpatient medications on file.   No current facility-administered medications for this visit.     Allergies-reviewed and updated No Known Allergies  Social History   Social History Narrative   Family: Married (wife Katelyn patient of Dr. Yong Channel). Hendrix son born early 2016.       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   Prior in [place]      Hobbies: basketball, video games, movies      Objective:  BP 104/76 (BP Location: Left Arm, Patient Position: Sitting, Cuff Size: Large)   Pulse 85   Temp 98.5 F (36.9 C) (Oral)   Ht 6' 2.25" (1.886 m)   Wt 226 lb 3.2 oz (102.6 kg)   SpO2 97%   BMI 28.85 kg/m  Gen: NAD, resting  comfortably HEENT: Mucous membranes are moist. Oropharynx normal Neck: no thyromegaly CV: RRR no murmurs rubs or gallops Lungs: CTAB no crackles, wheeze, rhonchi Abdomen: soft/nontender/nondistended/normal bowel sounds. No rebound or guarding. overweight Ext: no edema Skin: warm, dry Neuro: grossly normal, moves all extremities, PERRLA     Assessment and Plan:  28 y.o. male presenting for annual physical.  Health Maintenance counseling: 1. Anticipatory guidance: Patient counseled regarding regular dental exams -q6 months, eye exams - yearly,  avoiding smoking and second hand smoke , limiting alcohol to 2 beverages per day- 1 a week.   2. Risk factor reduction:  Advised patient of need for regular exercise and diet rich and fruits and vegetables to reduce risk of heart attack and stroke. Exercise- row machine for 25 miles each week. Diet- 18 months ago we set a weight goal of 215, down 6 lbs from last visitst. Still eating whatever he wants other than cutting dow on sugars- discussed more plant based diet.  Wt Readings from Last 3 Encounters:  02/05/18 226 lb 3.2 oz (102.6 kg)  07/11/16 232 lb 6.4 oz (105.4 kg)  02/04/16 232 lb 8 oz (105.5 kg)  3. Immunizations/screenings/ancillary studies-flu shot already had  Immunization History  Administered Date(s) Administered  . Influenza-Unspecified 11/14/2015, 11/19/2016, 12/16/2017  . Tdap 02/10/2014  4. Prostate cancer screening-  No family history, start at age 73    5. Colon cancer screening -  No family history, start at age 15  6. Skin cancer screening/prevention-no dermatologist.  Advised regular sunscreen use. Denies worrisome, changing, or new skin lesions.  7. Testicular cancer screening- advised monthly self exams  8. STD screening- patient opts out as monogamous.   36.  Birth control he is interested in a vasectomy for birth control-we will refer him today 73.  Never smoker   Status of chronic or acute concerns   [facility] in the past for tinnitus (still present but not as severe) and has been told has mild PTSD-doing well currently- has to see them every 2 years for pysch eval.  Has tried CBD oil and helpful for inflammation in joints and anxiety when occurs   He has not completed blood work either over the last 2 years that was ordered.  We discussed and he agrees to do today   Lab/Order associations:FASTING  Preventative health care - Plan: Lipid panel, CBC, Comprehensive metabolic panel  Vasectomy evaluation - Plan: CBC, Comprehensive metabolic panel, Ambulatory referral to Urology, CANCELED: Ambulatory referral to Urology  BMI 28.0-28.9,adult - Plan: CBC, Comprehensive metabolic panel  Screening for hyperlipidemia - Plan: Lipid panel  Return precautions advised.  Garret Reddish, MD

## 2018-03-09 ENCOUNTER — Ambulatory Visit: Payer: No Typology Code available for payment source | Admitting: Family Medicine

## 2018-03-25 MED FILL — DIAZEPAM 10 MG TABS: 10 | 1 days supply | Qty: 1 | Fill #0

## 2018-03-29 HISTORY — PX: VASECTOMY: SHX75

## 2018-04-01 ENCOUNTER — Ambulatory Visit (INDEPENDENT_AMBULATORY_CARE_PROVIDER_SITE_OTHER): Payer: No Typology Code available for payment source | Admitting: Family Medicine

## 2018-04-01 ENCOUNTER — Other Ambulatory Visit: Payer: Self-pay

## 2018-04-01 ENCOUNTER — Encounter: Payer: Self-pay | Admitting: Family Medicine

## 2018-04-01 VITALS — BP 130/92 | HR 80 | Temp 98.5°F | Ht 74.25 in | Wt 224.0 lb

## 2018-04-01 DIAGNOSIS — R030 Elevated blood-pressure reading, without diagnosis of hypertension: Secondary | ICD-10-CM | POA: Diagnosis not present

## 2018-04-01 DIAGNOSIS — J3489 Other specified disorders of nose and nasal sinuses: Secondary | ICD-10-CM

## 2018-04-01 DIAGNOSIS — D485 Neoplasm of uncertain behavior of skin: Secondary | ICD-10-CM

## 2018-04-01 DIAGNOSIS — D2239 Melanocytic nevi of other parts of face: Secondary | ICD-10-CM

## 2018-04-01 NOTE — Patient Instructions (Signed)
You had a biopsy today- if you would like- can use a small bandaid (you can also leave this to air dry since small)  leave bandage in place for 24 hours. May then remove and wash with soapy water but not scrub (would wait about 24 hours to clean even if dont use bandaid). After bathing, reapply triple antibiotic ointment and bandaid.  If it bleeds- apply pressure up to 30 minutes to an hour- seek care if continues to bleed past that point. If you have expanding redness or worsening pain then let us reevaluate the spot.   We have ordered labs or studies at this visit (pathology of lesion). It can take up to 1-2 weeks for results and processing. IF results require follow up or explanation, we will call you with instructions. Clinically stable results will be released to your [facility]. If you have not heard from [place] or cannot find your results in [facility] in 2 weeks please contact our office

## 2018-04-01 NOTE — Progress Notes (Signed)
  Phone [phone]   Subjective:  Martin Walsh is a 28 y.o. year old very pleasant male patient who presents for/with See problem oriented charting ROS- no fever, chills, nausea, vomiting.   Past Medical History-  Patient Active Problem List   Diagnosis Date Noted  . Tinnitus   . PTSD (post-traumatic stress disorder)   . Concussion     Medications- reviewed and updated No current outpatient medications on file.   No current facility-administered medications for this visit.      Objective:  BP (!) 130/92 (BP Location: Left Arm, Patient Position: Sitting, Cuff Size: Normal)   Pulse 80   Temp 98.5 F (36.9 C) (Oral)   Ht 6' 2.25" (1.886 m)   Wt 224 lb (101.6 kg)   SpO2 98%   BMI 28.57 kg/m  Gen: NAD, resting comfortably CV: RRR no murmurs rubs or gallops Lungs: CTAB no crackles, wheeze, rhonchi Ext: no edema Skin: warm, dry, over ala of right nose-there is a 2.5 x 2.5 mm erythematous papule.  Blanches slightly  Skin Biopsy Procedure Note   PRE-OP DIAGNOSIS: Lesion of nose/Neoplasm of uncertain behavior of skin - Plan: Dermatology pathology POST-OP DIAGNOSIS: Same  Size of lesion: 2.5 x 2.5 mm Location of lesion ala of right nose PROCEDURE:   Shave Biopsy       The area surrounding the skin lesion was prepared and draped in the usual sterile manner. The lesion was removed in the usual manner by the biopsy method noted above. Hemostasis was assured. The patient tolerated the procedure well.  Closure: Silver nitrate needed to stop bleeding  Followup: The patient tolerated the procedure well without complications.  Standard post-procedure care is explained and return precautions are given.     Assessment and Plan   Nose lesion/skin lesion S: Patient has noted small red bump on the side of his nose.  Present for 4-5 years but has noted some intermittent dried blood. States not pruritic. Not growing in size. Looked at old pictures and used to be fleshy colored and has  changed to reddish discoloratoin   A/P: Nose lesion that has begun to intermittently bleed and has had a change in coloration- could be as simple as cherry angioma but not a darker red in suspect this less likely.  Want to rule out basal cell carcinoma in particular but also squamous cell carcinoma.  Patient does not want to wait extended period to get into dermatology-would like to just make sure it is not cancerous-after discussion today we opted to do a biopsy.  We will reach out to him about results  # blood pressure slightly elevated today- normally is not. Just had large cup of coffee- we will monitor at next visit  Lab/Order associations: Lesion of nose - Plan: Dermatology pathology([place])  Neoplasm of uncertain behavior of skin - Plan: Dermatology pathology([place])  Elevated blood pressure reading  Return precautions advised.  Garret Reddish, MD

## 2018-05-07 ENCOUNTER — Encounter: Payer: Self-pay | Admitting: Family Medicine

## 2018-05-07 ENCOUNTER — Ambulatory Visit (INDEPENDENT_AMBULATORY_CARE_PROVIDER_SITE_OTHER): Payer: No Typology Code available for payment source | Admitting: Family Medicine

## 2018-05-07 VITALS — Ht 74.25 in | Wt 225.0 lb

## 2018-05-07 DIAGNOSIS — L255 Unspecified contact dermatitis due to plants, except food: Secondary | ICD-10-CM

## 2018-05-07 MED ORDER — PREDNISONE 20 MG PO TABS: ORAL_TABLET | ORAL | 0 refills | Status: AC

## 2018-05-07 MED FILL — predniSONE 20 MG TABS: 20 | 27 days supply | Qty: 12 | Fill #0

## 2018-05-07 NOTE — Progress Notes (Signed)
  Phone [phone]   Subjective:  Virtual visit via Video note. Chief complaint: Chief Complaint  Patient presents with  . Poison Ivy    Possible poison ivy or oak. rash started yesterday.    This visit type was conducted due to national recommendations for restrictions regarding the COVID-19 Pandemic (e.g. social distancing).  This format is felt to be most appropriate for this patient at this time balancing risks to patient and risks to population by having him in for in person visit.  No physical exam was performed (except for noted visual exam or audio findings with Telehealth visits).    Our team/I connected with Lenice Pressman on 05/07/18 at  3:00 PM EDT by a video enabled telemedicine application ([email or web address]) and verified that I am speaking with the correct person using two identifiers.  Location patient: [place] Location provider: [facility], office Persons participating in the virtual visit:  Patient, wife in the background  Our team/I discussed the limitations of evaluation and management by telemedicine and the availability of in person appointments. In light of current covid-19 pandemic, patient also understands that we are trying to protect them by minimizing in office contact if at all possible.  The patient expressed consent for telemedicine visit and agreed to proceed. Patient understands insurance will be billed.   ROS-see ROS below  Past Medical History-  Patient Active Problem List   Diagnosis Date Noted  . Tinnitus   . PTSD (post-traumatic stress disorder)   . Concussion     Medications- reviewed and updated No current outpatient medications on file.   No current facility-administered medications for this visit.      Objective:  Ht 6' 2.25" (1.886 m)   Wt 225 lb (102.1 kg)   BMI 28.69 kg/m  Gen: NAD, resting comfortably Lungs: nonlabored, normal respiratory rate  Skin: appears dry, erythema and papules across the face.  Also notes some linear patches of  erythema and vesicles on bilateral forearms.     Assessment and Plan   #Rash S:patient got into some poison ivy on Tuesday- started with bad rash on face starting yesterday- also up and down his arm. Also down onto neck. He was being careful but thinks he touched his tool after had washed hands and may have had oil on it. Itchy at times- also can feel really dry.  ROS-not ill appearing, no fever/chills. No new medications. Not immunocompromised. No mucus membrane involvement.  A/P: Contact dermatitis due to poison ivy.  Involves bilateral lower arms as well as the face-topicals not ideal in this setting.  We will do a prednisone taper-see prescription below -Warned of side effects like jitteriness, weight gain, insomnia -Warned of potential recurrence when stepping down prednisone dose-he should contact me if this occurs  Lab/Order associations: Contact dermatitis due to plant  Meds ordered this encounter  Medications  . predniSONE (DELTASONE) 20 MG tablet    Sig: Take 2 pills for 3 days, 1 pill for 3 days, 1/2 pill for 3 days, 1/2 pill every other day until finished    Dispense:  12 tablet    Refill:  0   Return precautions advised.  Garret Reddish, MD

## 2018-05-07 NOTE — Patient Instructions (Addendum)
Video visit

## 2019-02-07 DIAGNOSIS — H5213 Myopia, bilateral: Secondary | ICD-10-CM | POA: Diagnosis not present

## 2021-10-07 ENCOUNTER — Encounter: Payer: Self-pay | Admitting: *Deleted

## 2021-12-26 ENCOUNTER — Encounter: Payer: Self-pay | Admitting: *Deleted
